# Patient Record
Sex: Female | Born: 1995 | Race: Black or African American | Hispanic: No | Marital: Single | Smoking: Never smoker
Health system: Southern US, Community
[De-identification: ages and names within clinical notes are randomized; demographics above are authoritative.]

## PROBLEM LIST (undated history)

## (undated) HISTORY — PX: OTHER SURGICAL HISTORY: SHX169

---
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
## 2016-12-08 IMAGING — CT CT ABD-PELV W/ CM
2 of 4 series · 11 of 46 positions shown, 12 images · IV contrast (Iodine)
Comparison: None.

CLINICAL DATA: Acute onset of right-sided abdominal pain and
vomiting. Generalized chest pain. Initial encounter.

EXAM:
CT ABDOMEN AND PELVIS WITH CONTRAST
TECHNIQUE: Multidetector CT imaging of the abdomen and pelvis was performed
using the standard protocol following bolus administration of
intravenous contrast.
CONTRAST:  80mL OMNIPAQUE IOHEXOL 300 MG/ML  SOLN

[Series 301: routine, idose (2) · axial · 0.78mm/px · z∈[-132,+248]mm · 8 of 92 slices shown, 9 images]
[im 8/92  soft-tissue]
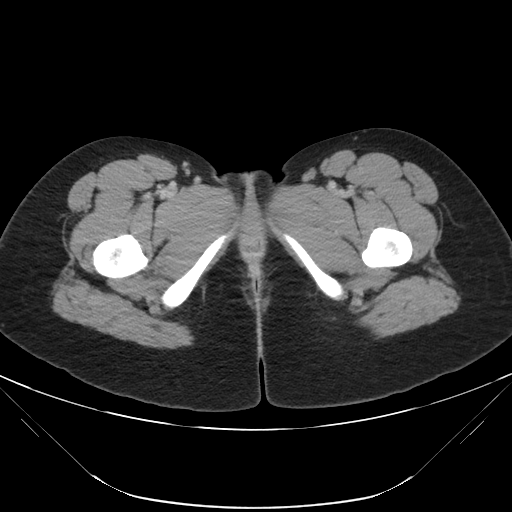
[im 8/92  bone]
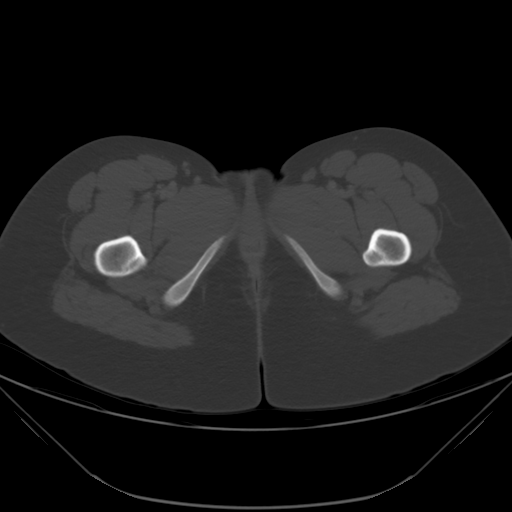
[im 20/92  soft-tissue]
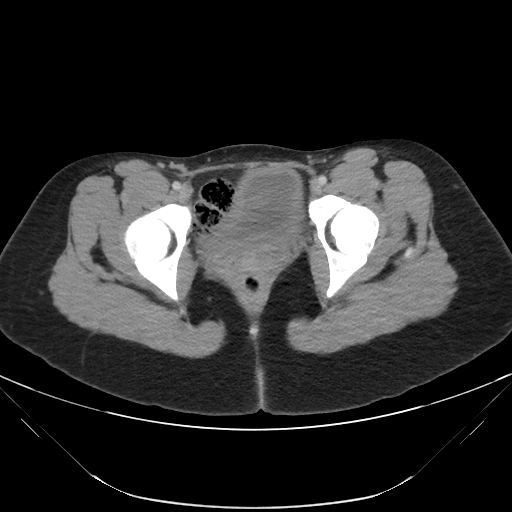
[im 28/92  soft-tissue]
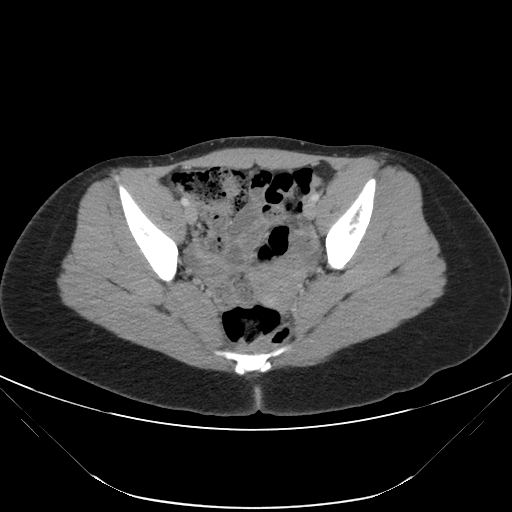
[im 40/92  soft-tissue]
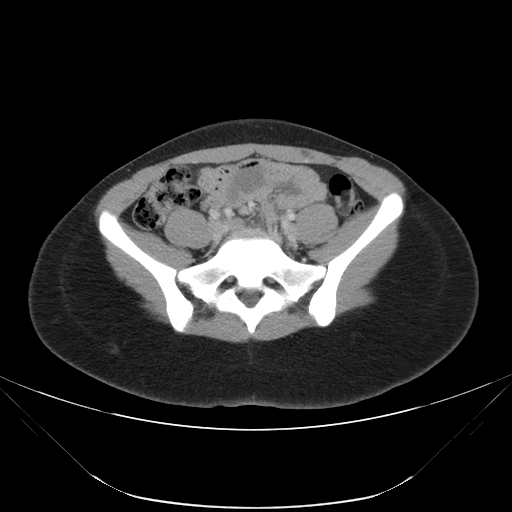
[im 52/92  soft-tissue]
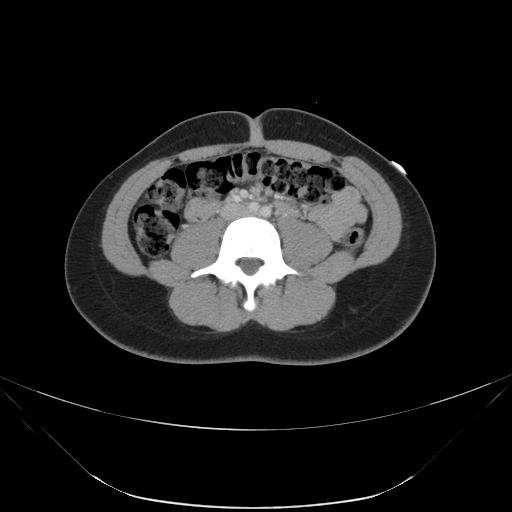
[im 64/92  soft-tissue]
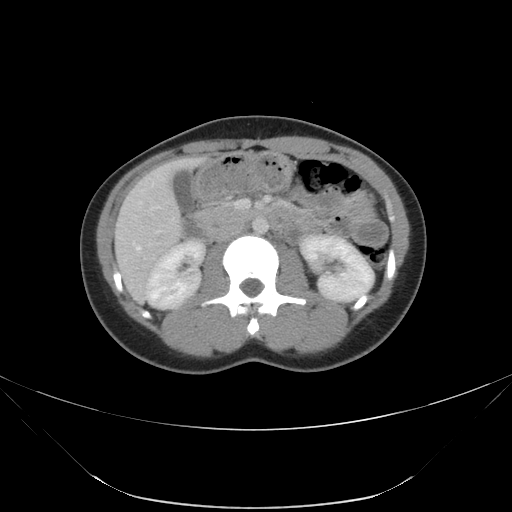
[im 72/92  soft-tissue]
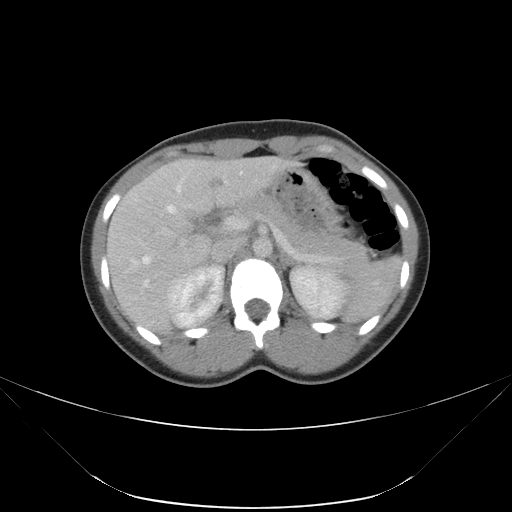
[im 84/92  soft-tissue]
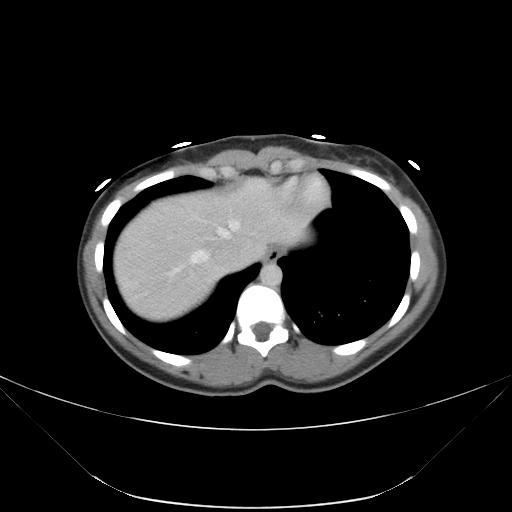

[Series 303: coronals, idose (2) · coronal · 0.45mm/px · 3 of 147 slices shown]
[im 49/147  soft-tissue]
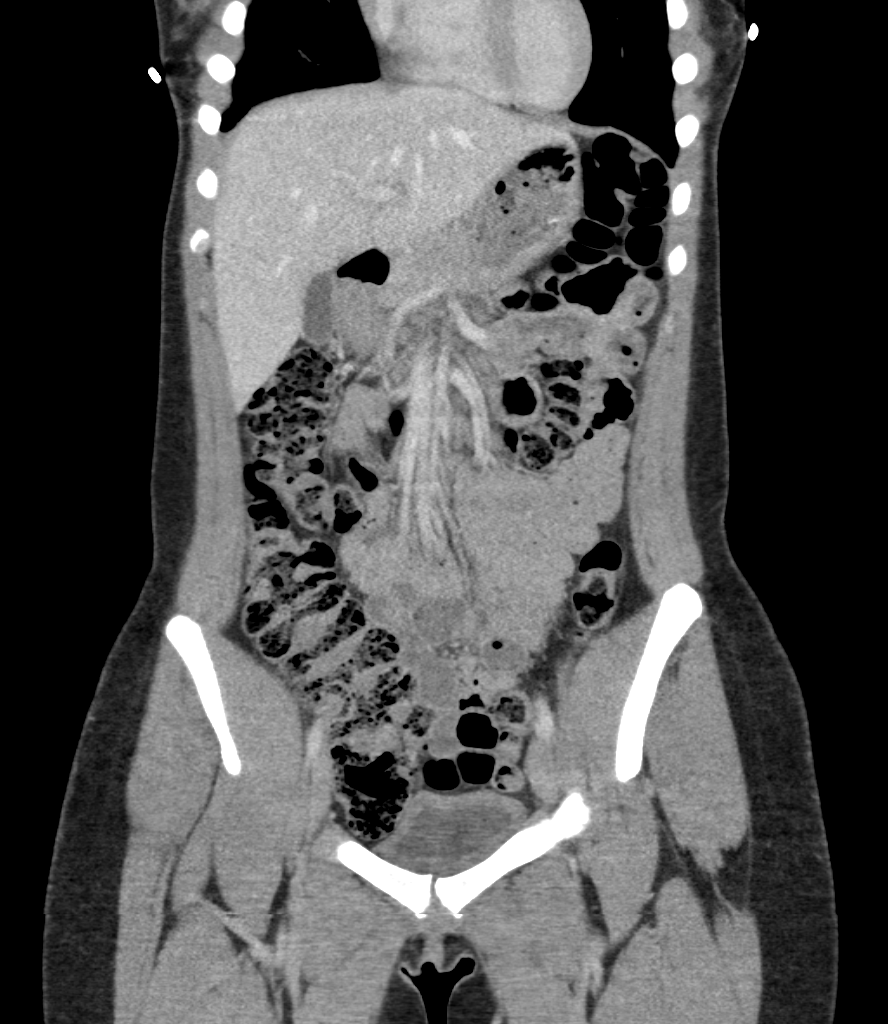
[im 65/147  soft-tissue]
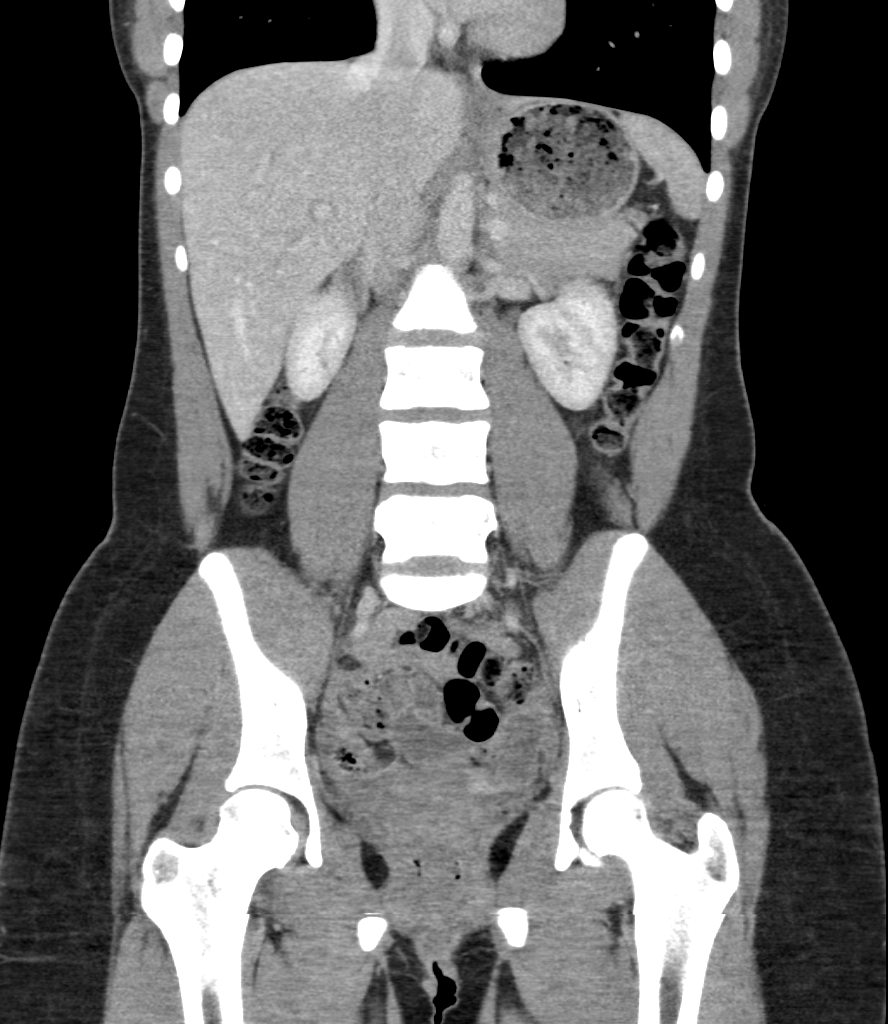
[im 82/147  soft-tissue]
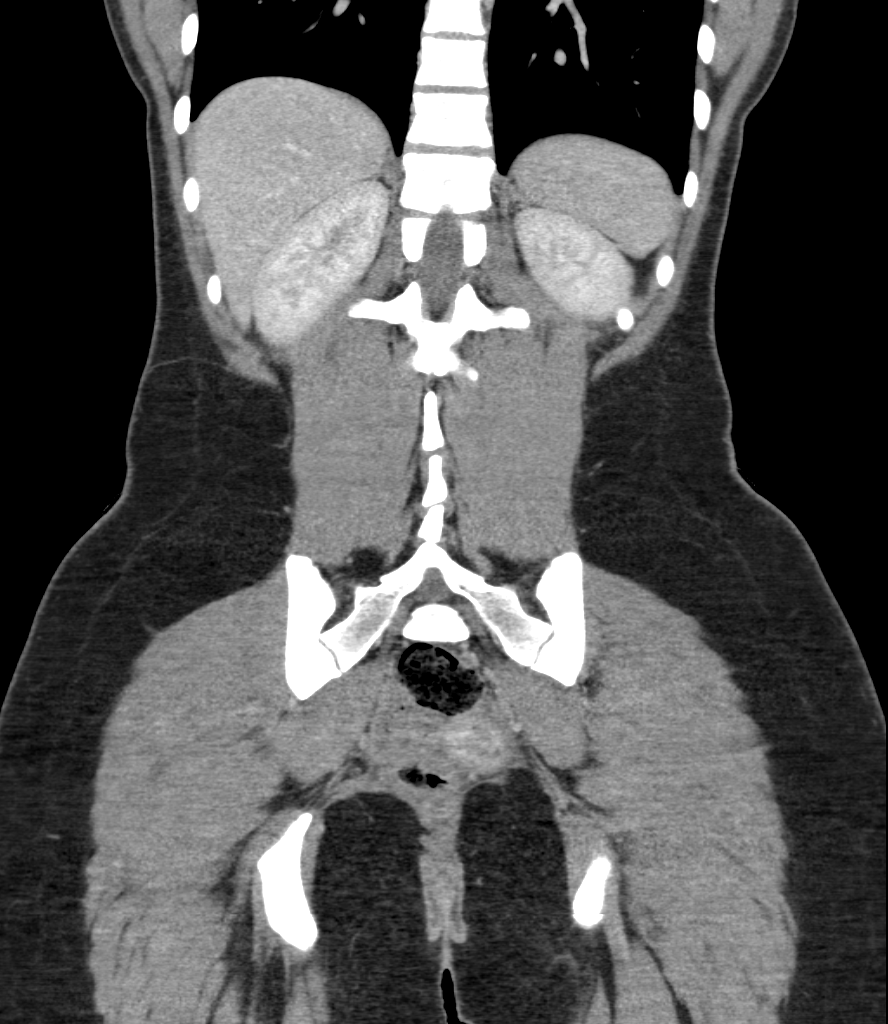

[11 of 46 positions shown; findings below may reference images not displayed]

FINDINGS: The visualized lung bases are clear.

The liver and spleen are unremarkable in appearance. The gallbladder
is within normal limits. The pancreas and adrenal glands are
unremarkable.

The kidneys are unremarkable in appearance. There is no evidence of
hydronephrosis. No renal or ureteral stones are seen. No perinephric
stranding is appreciated.

No free fluid is identified. The small bowel is unremarkable in
appearance. The stomach is within normal limits. No acute vascular
abnormalities are seen.

The appendix is difficult to fully characterize, but appears grossly
unremarkable. The colon is unremarkable in appearance.

The bladder is mildly distended and grossly unremarkable. The uterus
is unremarkable in appearance. The ovaries are relatively symmetric.
No suspicious adnexal masses are seen. No inguinal lymphadenopathy
is seen.

No acute osseous abnormalities are identified.
IMPRESSION: Unremarkable contrast-enhanced CT of the abdomen and pelvis.

## 2022-07-08 ENCOUNTER — Other Ambulatory Visit: Payer: Self-pay | Admitting: Rheumatology

## 2022-07-08 ENCOUNTER — Ambulatory Visit
Admission: RE | Admit: 2022-07-08 | Discharge: 2022-07-08 | Disposition: A | Discharge status: Home / Self Care | Payer: Managed Care, Other (non HMO) | Source: Ambulatory Visit | Attending: Rheumatology | Admitting: Rheumatology

## 2022-07-08 DIAGNOSIS — M064 Inflammatory polyarthropathy: Secondary | ICD-10-CM

## 2022-07-08 DIAGNOSIS — M170 Bilateral primary osteoarthritis of knee: Secondary | ICD-10-CM
